# Patient Record
Sex: Male | Born: 1985
Health system: Southern US, Community
[De-identification: ages and names within clinical notes are randomized; demographics above are authoritative.]

## PROBLEM LIST (undated history)

## (undated) DIAGNOSIS — Z8619 Personal history of other infectious and parasitic diseases: Secondary | ICD-10-CM

## (undated) DIAGNOSIS — M549 Dorsalgia, unspecified: Secondary | ICD-10-CM

## (undated) DIAGNOSIS — T7840XA Allergy, unspecified, initial encounter: Secondary | ICD-10-CM

## (undated) HISTORY — DX: Allergy, unspecified, initial encounter: T78.40XA

## (undated) HISTORY — DX: Dorsalgia, unspecified: M54.9

## (undated) HISTORY — DX: Personal history of other infectious and parasitic diseases: Z86.19

---

## 2016-06-26 ENCOUNTER — Ambulatory Visit
Admission: RE | Admit: 2016-06-26 | Discharge: 2016-06-26 | Disposition: A | Discharge status: Home / Self Care | Payer: 59 | Source: Ambulatory Visit | Attending: Family Medicine | Admitting: Family Medicine

## 2016-06-26 ENCOUNTER — Encounter: Payer: Self-pay | Admitting: Family Medicine

## 2016-06-26 ENCOUNTER — Ambulatory Visit (INDEPENDENT_AMBULATORY_CARE_PROVIDER_SITE_OTHER): Payer: 59 | Admitting: Family Medicine

## 2016-06-26 VITALS — BP 120/84 | HR 80 | Temp 98.7°F | Resp 16 | Ht 70.0 in | Wt 272.0 lb

## 2016-06-26 DIAGNOSIS — M5441 Lumbago with sciatica, right side: Secondary | ICD-10-CM | POA: Insufficient documentation

## 2016-06-26 DIAGNOSIS — M4046 Postural lordosis, lumbar region: Secondary | ICD-10-CM | POA: Diagnosis not present

## 2016-06-26 DIAGNOSIS — M5146 Schmorl's nodes, lumbar region: Secondary | ICD-10-CM | POA: Insufficient documentation

## 2016-06-26 MED ORDER — NAPROXEN 500 MG PO TABS: 500.0000 mg | ORAL_TABLET | Freq: Two times a day (BID) | ORAL | 1 refills | Status: DC | PRN

## 2016-06-26 NOTE — Patient Instructions (Addendum)
Ordered LS spine x-ray.  Referred to PT for low back pain.  Sent Naproxen 50 mg bid to [facility].   Patient to return in 2 months for CPE.

## 2016-06-26 NOTE — Progress Notes (Signed)
Patient: Jeremy Bradley Male    DOB: 1985/10/18   31 y.o.   MRN: [id number] Visit Date: 06/26/2016  Today's Provider: Megan Mans, MD   Chief Complaint  Patient presents with  . Establish Care  . Back Pain   Subjective:    Patient has recurrent lower back pain. Pain on right lower region with radiation down buttocks and right leg. Patient is taking otc ibuprofen and acetaminophen with only mild relief.     Back Pain  This is a recurrent problem. The problem occurs intermittently. The pain is present in the lumbar spine. The pain radiates to the right thigh. The pain is moderate. The symptoms are aggravated by bending, position and twisting. Associated symptoms include leg pain, numbness, tingling and weakness. Pertinent negatives include no abdominal pain, bladder incontinence, bowel incontinence, chest pain, dysuria, fever, headaches, paresis, paresthesias, pelvic pain or weight loss. He has tried NSAIDs for the symptoms. The treatment provided mild relief.      No Known Allergies   Current Outpatient Prescriptions:  .  ACETAMINOPHEN PO, Take by mouth., Disp: , Rfl:  .  IBUPROFEN PO, Take by mouth., Disp: , Rfl:   Review of Systems  Constitutional: Negative for fever and weight loss.  HENT: Positive for congestion, rhinorrhea, sinus pressure and sneezing.   Eyes: Positive for itching.  Respiratory: Negative.   Cardiovascular: Negative.  Negative for chest pain.  Gastrointestinal: Negative.  Negative for abdominal pain and bowel incontinence.  Endocrine: Negative.   Genitourinary: Negative.  Negative for bladder incontinence, dysuria and pelvic pain.  Musculoskeletal: Positive for back pain.  Skin: Negative.   Allergic/Immunologic: Positive for environmental allergies.  Neurological: Positive for tingling, weakness and numbness. Negative for headaches and paresthesias.  Hematological: Negative.   Psychiatric/Behavioral: Negative.     Social History    Substance Use Topics  . Smoking status: Never Smoker  . Smokeless tobacco: Never Used  . Alcohol use Yes   Objective:   BP 120/84 (BP Location: Left Arm, Patient Position: Sitting, Cuff Size: Large)   Pulse 80   Temp 98.7 F (37.1 C) (Oral)   Resp 16   Ht  (1.778 m)   Wt 272 lb (123.4 kg)   SpO2 97%   BMI 39.03 kg/m  Vitals:   06/26/16 0917  BP: 120/84  Pulse: 80  Resp: 16  Temp: 98.7 F (37.1 C)  TempSrc: Oral  SpO2: 97%  Weight: 272 lb (123.4 kg)  Height:  (1.778 m)     Physical Exam  Constitutional: He is oriented to person, place, and time. He appears well-developed and well-nourished.  HENT:  Head: Normocephalic and atraumatic.  Right Ear: External ear normal.  Left Ear: External ear normal.  Nose: Nose normal.  Eyes: Conjunctivae are normal. No scleral icterus.  Neck: Neck supple. No thyromegaly present.  Cardiovascular: Normal rate, regular rhythm and normal heart sounds.   Pulmonary/Chest: Effort normal and breath sounds normal.  Abdominal: Soft.  Neurological: He is alert and oriented to person, place, and time.  Skin: Skin is warm and dry.  Psychiatric: He has a normal mood and affect. His behavior is normal. Judgment and thought content normal.        Assessment & Plan:     1. Right-sided low back pain with right-sided sciatica, unspecified chronicity Conservative therapy for now--rationale explained to pt. - DG Lumbar Spine Complete; Future - naproxen (NAPROSYN) 500 MG tablet; Take 1 tablet (500 mg  total) by mouth 2 (two) times daily as needed.  Dispense: 60 tablet; Refill: 1 - Ambulatory referral to Physical Therapy   2.Obesity Wt loss encouraged.  I have done the exam and reviewed the above chart and it is accurate to the best of my knowledge. Dentist has been used in this note in any air is in the dictation or transcription are unintentional.  Megan Mans, MD  [facility]

## 2016-06-27 ENCOUNTER — Telehealth: Payer: Self-pay

## 2016-06-27 NOTE — Telephone Encounter (Signed)
-----   Message from Maple Hudson., MD sent at 06/26/2016  2:12 PM EDT ----- Muscle spasm noted.

## 2016-06-27 NOTE — Telephone Encounter (Signed)
Tried calling patient to advise him of results from  Lumbar spine x ray. Left message to call back.

## 2016-06-27 NOTE — Telephone Encounter (Signed)
Patient advised as below.  

## 2016-07-18 ENCOUNTER — Ambulatory Visit: Payer: 59

## 2016-07-23 ENCOUNTER — Ambulatory Visit: Payer: 59

## 2016-08-09 ENCOUNTER — Other Ambulatory Visit: Payer: Self-pay | Admitting: Family Medicine

## 2016-08-09 DIAGNOSIS — M5441 Lumbago with sciatica, right side: Secondary | ICD-10-CM

## 2016-09-06 ENCOUNTER — Encounter: Payer: Self-pay | Admitting: Family Medicine

## 2016-09-06 ENCOUNTER — Ambulatory Visit (INDEPENDENT_AMBULATORY_CARE_PROVIDER_SITE_OTHER): Payer: 59 | Admitting: Family Medicine

## 2016-09-06 VITALS — BP 122/72 | HR 86 | Temp 98.3°F | Resp 16 | Ht 70.0 in | Wt 272.0 lb

## 2016-09-06 DIAGNOSIS — M5441 Lumbago with sciatica, right side: Secondary | ICD-10-CM | POA: Diagnosis not present

## 2016-09-06 DIAGNOSIS — Z0000 Encounter for general adult medical examination without abnormal findings: Secondary | ICD-10-CM

## 2016-09-06 DIAGNOSIS — Z23 Encounter for immunization: Secondary | ICD-10-CM

## 2016-09-06 LAB — POCT URINALYSIS DIPSTICK
Bilirubin, UA: NEGATIVE
Glucose, UA: NEGATIVE
Ketones, UA: NEGATIVE
Leukocytes, UA: NEGATIVE
NITRITE UA: NEGATIVE
PH UA: 6 (ref 5.0–8.0)
Protein, UA: NEGATIVE
RBC UA: NEGATIVE
SPEC GRAV UA: 1.02 (ref 1.010–1.025)
UROBILINOGEN UA: 0.2 U/dL

## 2016-09-06 NOTE — Progress Notes (Signed)
Patient: Jeremy BoundWilliam B Bradley, Male    DOB: 06/28/1985, 31 y.o.   MRN: [id number] Visit Date: 09/06/2016  Today's Provider: Megan Mansichard Rosanna Bickle Jr, MD   Chief Complaint  Patient presents with  . Annual Exam   Subjective:    Annual physical exam Jeremy BoundWilliam B Bradley is a 31 y.o. male who presents today for health maintenance and complete physical. He feels well. He reports he is not exercising. He reports he is sleeping well.  -----------------------------------------------------------------   Review of Systems  Constitutional: Negative.   HENT: Positive for congestion and sneezing.   Eyes: Positive for itching.  Respiratory: Negative.   Cardiovascular: Negative.   Gastrointestinal: Negative.   Endocrine: Negative.   Genitourinary: Negative.   Musculoskeletal: Positive for back pain.  Skin: Negative.   Allergic/Immunologic: Positive for environmental allergies.  Neurological: Negative.   Hematological: Negative.   Psychiatric/Behavioral: Negative.     Social History      He  reports that he has never smoked. He has never used smokeless tobacco. He reports that he drinks alcohol. He reports that he does not use drugs.       Social History   Social History  . Marital status: Married    Spouse name: N/A  . Number of children: N/A  . Years of education: college grad   Occupational History  . employed     Social History Main Topics  . Smoking status: Never Smoker  . Smokeless tobacco: Never Used  . Alcohol use Yes  . Drug use: No  . Sexual activity: Not Asked   Other Topics Concern  . None   Social History Narrative  . None    Past Medical History:  Diagnosis Date  . Allergy   . Back pain    recurrent  . History of chicken pox      There are no active problems to display for this patient.   History reviewed. No pertinent surgical history.  Family History        Family Status  Relation Status  . Mother Alive  . Father Alive        His family  history is not on file.     No Known Allergies   Current Outpatient Prescriptions:  .  naproxen (NAPROSYN) 500 MG tablet, TAKE ONE TABLET BY MOUTH TWICE DAILY AS NEEDED, Disp: 60 tablet, Rfl: 1   Patient Care Team: Maple HudsonGilbert, Dorthea Maina L Jr., MD as PCP - General (Family Medicine)      Objective:   Vitals: BP 122/72 (BP Location: Left Arm, Patient Position: Sitting, Cuff Size: Large)   Pulse 86   Temp 98.3 F (36.8 C) (Oral)   Resp 16   Ht 5\' 10"  (1.778 m)   Wt 272 lb (123.4 kg)   BMI 39.03 kg/m    Vitals:   09/06/16 0948  BP: 122/72  Pulse: 86  Resp: 16  Temp: 98.3 F (36.8 C)  TempSrc: Oral  Weight: 272 lb (123.4 kg)  Height: 5\' 10"  (1.778 m)     Physical Exam  Constitutional: He is oriented to person, place, and time. He appears well-developed and well-nourished.  HENT:  Head: Normocephalic and atraumatic.  Right Ear: External ear normal.  Left Ear: External ear normal.  Nose: Nose normal.  Mouth/Throat: Oropharynx is clear and moist.  Eyes: Conjunctivae are normal. No scleral icterus.  Neck: No thyromegaly present.  Cardiovascular: Normal rate, regular rhythm and normal heart sounds.   Pulmonary/Chest: Effort normal  and breath sounds normal.  Abdominal: Soft.  Genitourinary: Penis normal.  Musculoskeletal: Normal range of motion.  Lymphadenopathy:    He has no cervical adenopathy.  Neurological: He is alert and oriented to person, place, and time.  Skin: Skin is warm and dry.  Psychiatric: He has a normal mood and affect. His behavior is normal. Judgment and thought content normal.     Depression Screen PHQ 2/9 Scores 09/06/2016  PHQ - 2 Score 0  PHQ- 9 Score 0      Assessment & Plan:     Routine Health Maintenance and Physical Exam  Exercise Activities and Dietary recommendations Goals    None       There is no immunization history on file for this patient.  Health Maintenance  Topic Date Due  . HIV Screening  10/01/2002  .  TETANUS/TDAP  10/01/2006  . INFLUENZA VACCINE  10/24/2016     Discussed health benefits of physical activity, and encouraged him to engage in regular exercise appropriate for his age and condition.  Chronic Low Back Pain Discussed stretching and back hygiene. Obesity Discussed D and E with goal waist size of 36 inches--presently 40.   --------------------------------------------------------------------   I have done the exam and reviewed the above chart and it is accurate to the best of my knowledge. Dentist has been used in this note in any air is in the dictation or transcription are unintentional.  Megan Mans, MD  [facility]

## 2016-09-07 LAB — CBC WITH DIFFERENTIAL/PLATELET
BASOS: 0 %
Basophils Absolute: 0 10*3/uL (ref 0.0–0.2)
EOS (ABSOLUTE): 0.1 10*3/uL (ref 0.0–0.4)
Eos: 1 %
HEMATOCRIT: 43.4 % (ref 37.5–51.0)
HEMOGLOBIN: 15.1 g/dL (ref 13.0–17.7)
IMMATURE GRANS (ABS): 0 10*3/uL (ref 0.0–0.1)
IMMATURE GRANULOCYTES: 0 %
LYMPHS: 36 %
Lymphocytes Absolute: 2.1 10*3/uL (ref 0.7–3.1)
MCH: 29.5 pg (ref 26.6–33.0)
MCHC: 34.8 g/dL (ref 31.5–35.7)
MCV: 85 fL (ref 79–97)
MONOS ABS: 0.6 10*3/uL (ref 0.1–0.9)
Monocytes: 10 %
NEUTROS PCT: 53 %
Neutrophils Absolute: 3.1 10*3/uL (ref 1.4–7.0)
Platelets: 247 10*3/uL (ref 150–379)
RBC: 5.12 x10E6/uL (ref 4.14–5.80)
RDW: 14.1 % (ref 12.3–15.4)
WBC: 5.9 10*3/uL (ref 3.4–10.8)

## 2016-09-07 LAB — COMPREHENSIVE METABOLIC PANEL
ALBUMIN: 4.7 g/dL (ref 3.5–5.5)
ALT: 28 IU/L (ref 0–44)
AST: 19 IU/L (ref 0–40)
Albumin/Globulin Ratio: 1.5 (ref 1.2–2.2)
Alkaline Phosphatase: 81 IU/L (ref 39–117)
BUN/Creatinine Ratio: 16 (ref 9–20)
BUN: 14 mg/dL (ref 6–20)
Bilirubin Total: 0.7 mg/dL (ref 0.0–1.2)
CALCIUM: 10.2 mg/dL (ref 8.7–10.2)
CO2: 21 mmol/L (ref 20–29)
CREATININE: 0.88 mg/dL (ref 0.76–1.27)
Chloride: 102 mmol/L (ref 96–106)
GFR, EST AFRICAN AMERICAN: 135 mL/min/{1.73_m2} (ref >59)
GFR, EST NON AFRICAN AMERICAN: 117 mL/min/{1.73_m2} (ref >59)
GLOBULIN, TOTAL: 3.1 g/dL (ref 1.5–4.5)
Glucose: 88 mg/dL (ref 65–99)
Potassium: 5.3 mmol/L — ABNORMAL HIGH (ref 3.5–5.2)
SODIUM: 141 mmol/L (ref 134–144)
TOTAL PROTEIN: 7.8 g/dL (ref 6.0–8.5)

## 2016-09-07 LAB — LIPID PANEL WITH LDL/HDL RATIO
Cholesterol, Total: 185 mg/dL (ref 100–199)
HDL: 37 mg/dL — AB (ref >39)
LDL CALC: 124 mg/dL — AB (ref 0–99)
LDL/HDL RATIO: 3.4 ratio (ref 0.0–3.6)
Triglycerides: 121 mg/dL (ref 0–149)
VLDL CHOLESTEROL CAL: 24 mg/dL (ref 5–40)

## 2016-09-07 LAB — TSH: TSH: 2.57 u[IU]/mL (ref 0.450–4.500)

## 2016-09-10 NOTE — Progress Notes (Signed)
Advised  ED 

## 2017-03-07 ENCOUNTER — Ambulatory Visit: Payer: 59 | Admitting: Family Medicine

## 2017-03-07 ENCOUNTER — Encounter: Payer: Self-pay | Admitting: Family Medicine

## 2017-03-07 NOTE — Progress Notes (Signed)
       Patient: Jeremy BoundWilliam B Bradley Male    DOB: 02/25/1986   31 y.o.   MRN: [id number] Visit Date: 03/07/2017  Today's Provider: Megan Mansichard Grayson Pfefferle Jr, MD   Chief Complaint  Patient presents with  . Follow-up   Subjective:    HPI Pt is here for a 6 month follow up. He reports that he is feeling well. He has gained some weight since hurting his back but pt reports that his wife has since had a baby and he has not had as much time to go to the gym.       No Known Allergies   Current Outpatient Medications:  .  naproxen (NAPROSYN) 500 MG tablet, TAKE ONE TABLET BY MOUTH TWICE DAILY AS NEEDED, Disp: 60 tablet, Rfl: 1  Review of Systems  Constitutional: Negative.   HENT: Negative.   Eyes: Negative.   Respiratory: Negative.   Cardiovascular: Negative.   Gastrointestinal: Negative.   Endocrine: Negative.   Genitourinary: Negative.   Musculoskeletal: Positive for back pain.  Skin: Negative.   Allergic/Immunologic: Negative.   Neurological: Negative.   Hematological: Negative.   Psychiatric/Behavioral: Negative.     Social History   Tobacco Use  . Smoking status: Never Smoker  . Smokeless tobacco: Never Used  Substance Use Topics  . Alcohol use: Yes   Objective:   BP 124/80 (BP Location: Left Arm, Patient Position: Sitting, Cuff Size: Large)   Pulse 88   Temp 98.3 F (36.8 C) (Oral)   Resp 16   Wt 284 lb (128.8 kg)   BMI 40.75 kg/m  Vitals:   03/07/17 1126  BP: 124/80  Pulse: 88  Resp: 16  Temp: 98.3 F (36.8 C)  TempSrc: Oral  Weight: 284 lb (128.8 kg)     Physical Exam  Constitutional: He is oriented to person, place, and time. He appears well-developed and well-nourished.  HENT:  Head: Normocephalic and atraumatic.  Right Ear: External ear normal.  Left Ear: External ear normal.  Eyes: Conjunctivae are normal. No scleral icterus.  Neck: No thyromegaly present.  Cardiovascular: Normal rate, regular rhythm and normal heart sounds.  Pulmonary/Chest:  Effort normal and breath sounds normal.  Abdominal: Soft.  Lymphadenopathy:    He has no cervical adenopathy.  Neurological: He is alert and oriented to person, place, and time.  Skin: Skin is warm and dry.  Psychiatric: He has a normal mood and affect. His behavior is normal. Judgment and thought content normal.        Assessment & Plan:     Obesity Diet and exercise habits discussed at length  Mechanical LBP Stretching discussed as he is too young to take her chronic NSAID.     I have done the exam and reviewed the above chart and it is accurate to the best of my knowledge. DentistDragon  technology has been used in this note in any air is in the dictation or transcription are unintentional.  Megan Mansichard Yariel Ferraris Jr, MD  [facility]

## 2017-03-28 ENCOUNTER — Other Ambulatory Visit: Payer: Self-pay | Admitting: Emergency Medicine

## 2017-03-28 DIAGNOSIS — M5441 Lumbago with sciatica, right side: Secondary | ICD-10-CM

## 2017-03-28 MED ORDER — NAPROXEN 500 MG PO TABS: 500.0000 mg | ORAL_TABLET | Freq: Two times a day (BID) | ORAL | 11 refills | Status: DC | PRN

## 2017-03-28 NOTE — Progress Notes (Signed)
Per Dr. Sullivan LoneGilbert verbal order. Refilled naproxen

## 2017-09-10 ENCOUNTER — Encounter: Payer: Self-pay | Admitting: Family Medicine

## 2018-04-02 ENCOUNTER — Ambulatory Visit: Payer: 59 | Admitting: Family Medicine

## 2018-04-02 ENCOUNTER — Other Ambulatory Visit: Payer: Self-pay | Admitting: Family Medicine

## 2018-04-02 ENCOUNTER — Encounter: Payer: Self-pay | Admitting: Family Medicine

## 2018-04-02 VITALS — BP 116/66 | HR 88 | Temp 98.4°F | Resp 16 | Wt 252.0 lb

## 2018-04-02 DIAGNOSIS — J069 Acute upper respiratory infection, unspecified: Secondary | ICD-10-CM

## 2018-04-02 DIAGNOSIS — M5441 Lumbago with sciatica, right side: Secondary | ICD-10-CM

## 2018-04-02 NOTE — Progress Notes (Signed)
  Subjective:     Patient ID: Jeremy Bradley, male   DOB: May 06, 1985, 33 y.o.   MRN: [id number] Chief Complaint  Patient presents with  . URI    Started about four days ago.   HPI States his child and brother have been sick as well. Describes cold sx: "I never get sick". No flu shot.  Review of Systems     Objective:   Physical Exam Constitutional:      General: He is not in acute distress.    Appearance: He is ill-appearing.   Ears: T.M's intact without inflammation Throat: mild tonsillar enlargement without exudate Neck:bilateral anterior cervical nodes Lungs: clear     Assessment:    1. URI, acute    Plan:    Discussed use of Mucinex D for congestion, Delsym for cough, and Benadryl for postnasal drainage. Discussed natural history of his illness.

## 2018-04-02 NOTE — Patient Instructions (Signed)
Discussed use of Mucinex D for congestion, Delsym for cough, and Benadryl for postnasal drainage. Let me know if sinuses not improving over the course of the week.

## 2018-12-18 IMAGING — CR DG LUMBAR SPINE COMPLETE 4+V
1 series · 5 of 5 positions shown · non-contrast
Comparison: None.

CLINICAL DATA: Low back and right leg pain after bending over to
tie his shoes 3 weeks ago.

EXAM:
LUMBAR SPINE - COMPLETE 4+ VIEW

[Series 1: dg lumbar spine complete 4 +v · 0.14mm/px · 5 of 5 slices shown]
[im 1/5]
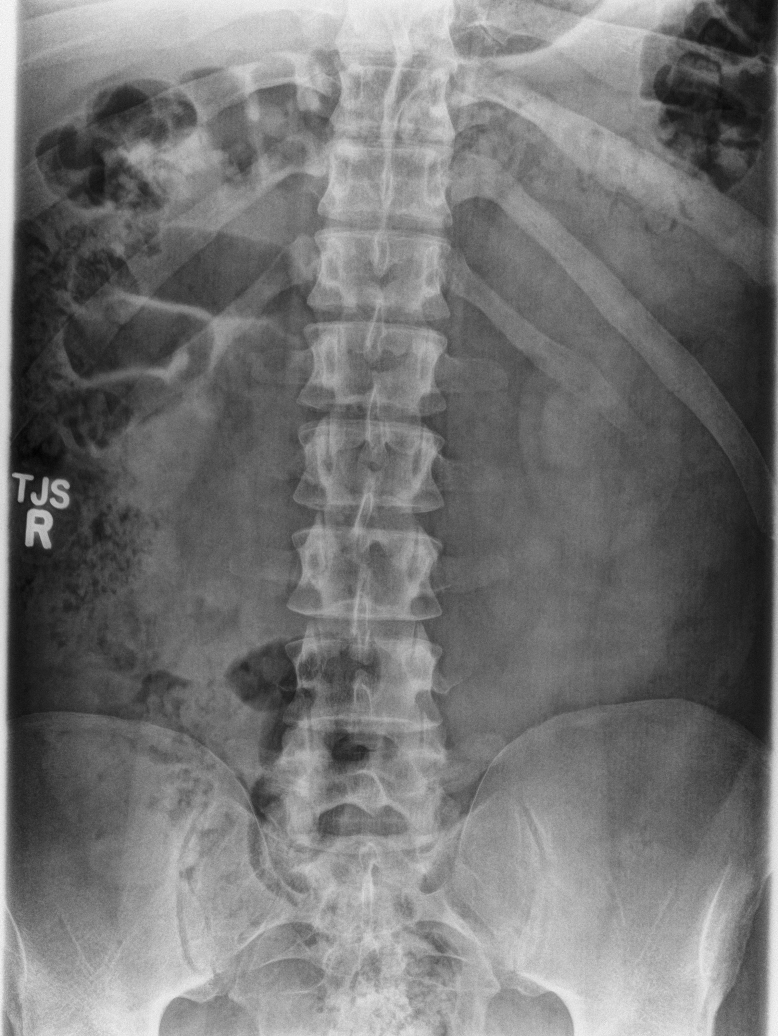
[im 2/5]
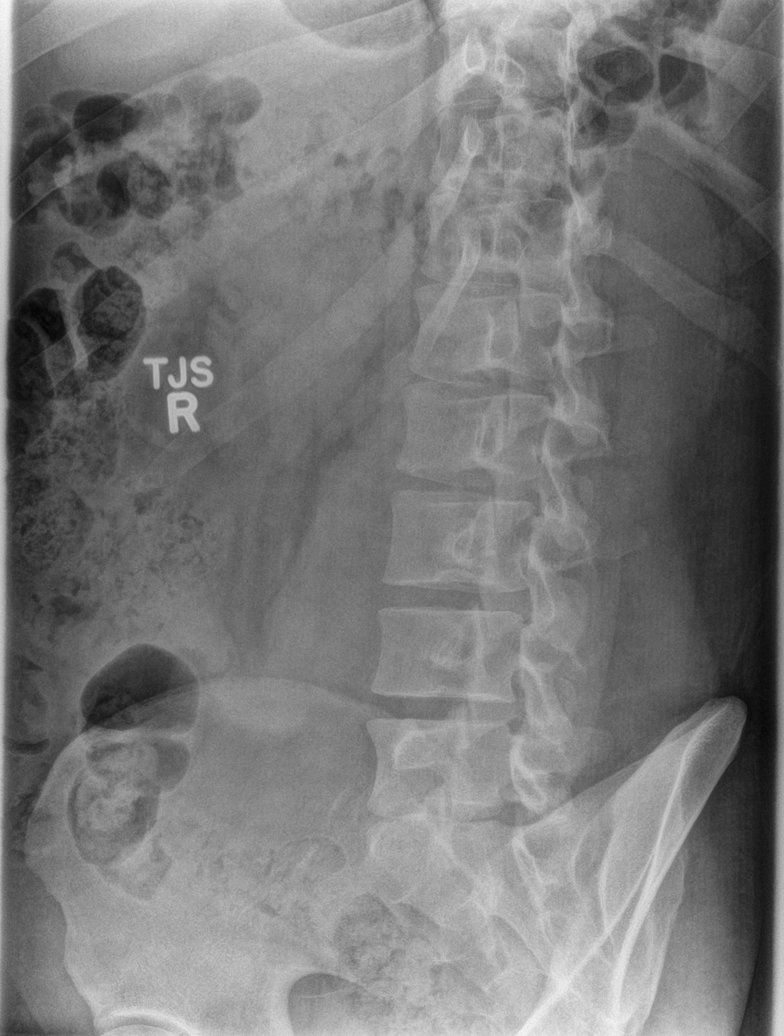
[im 3/5]
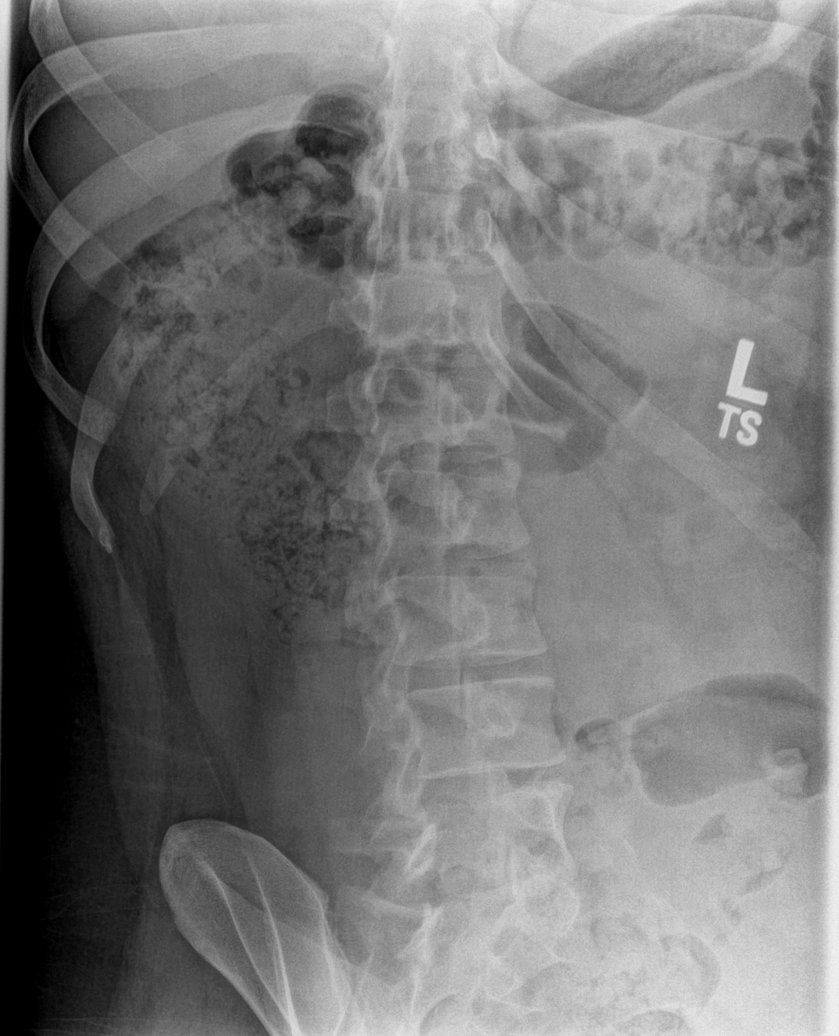
[im 4/5]
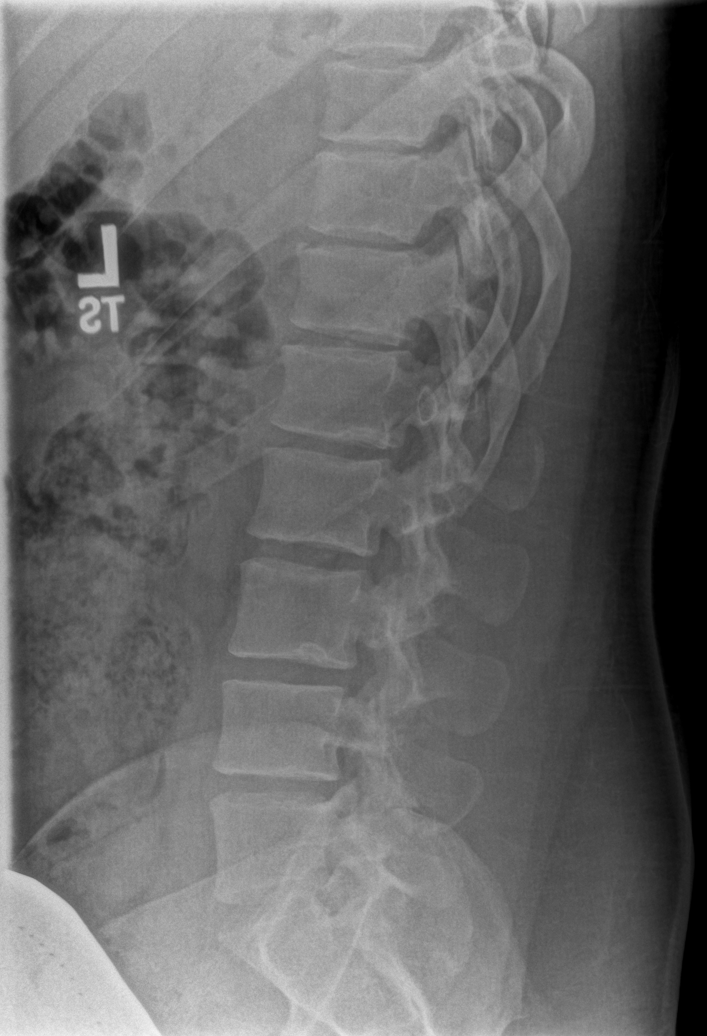
[im 5/5]
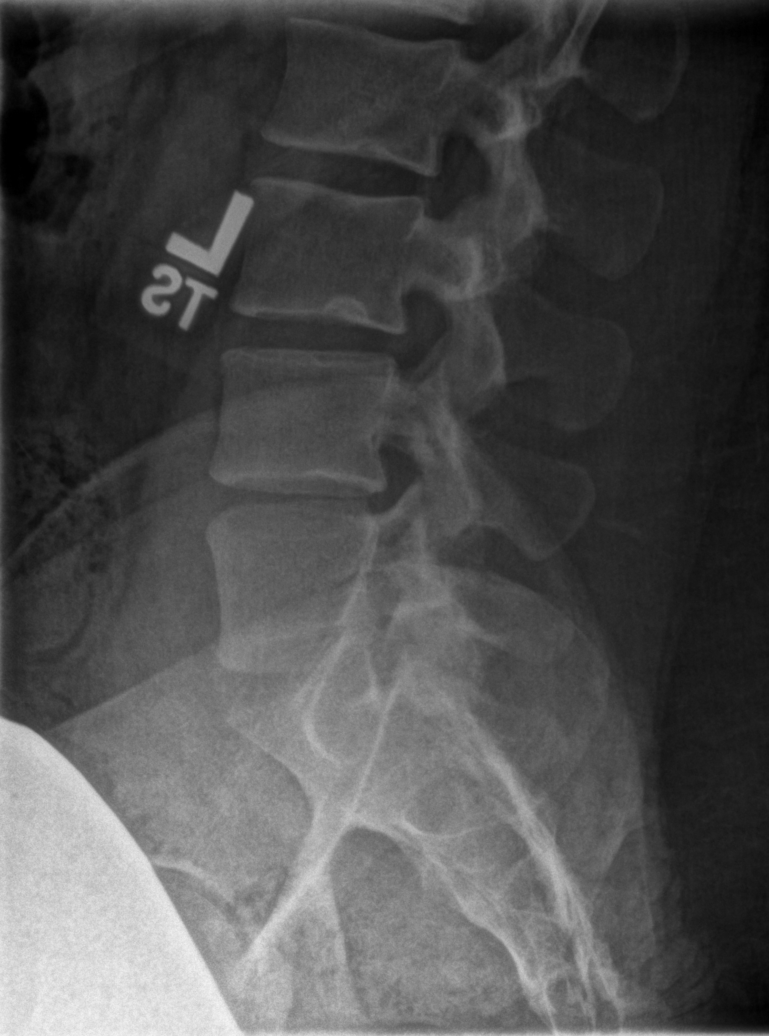

[5 of 5 positions shown; findings below may reference images not displayed]

FINDINGS: Straightening of the normal lumbar lordosis. Small Schmorl's nodes
at multiple levels. No fractures, pars defects or subluxations.
IMPRESSION: Straightening of the normal lumbar lordosis and small Schmorl's
nodes at multiple levels. Otherwise, normal examination.
